# Patient Record
Sex: Female | Born: 1995 | Hispanic: No | Marital: Single | Smoking: Never smoker
Health system: Southern US, Community
[De-identification: ages and names within clinical notes are randomized; demographics above are authoritative.]

## PROBLEM LIST (undated history)

## (undated) DIAGNOSIS — E669 Obesity, unspecified: Secondary | ICD-10-CM

## (undated) DIAGNOSIS — J45909 Unspecified asthma, uncomplicated: Secondary | ICD-10-CM

---
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
## 2016-05-12 IMAGING — CT CT ANGIO CHEST
2 of 8 series · 18 of 46 positions shown · IV contrast (omnipaque)
Comparison: 01/10/2014, 01/09/2014

CLINICAL DATA: Chest trauma, rib fractures, mediastinal widening in
tracheal deviation by follow-up chest x-rays

EXAM:
CT ANGIOGRAPHY CHEST WITH CONTRAST
TECHNIQUE: Multidetector CT imaging of the chest was performed using the
standard protocol during bolus administration of intravenous
contrast. Multiplanar CT image reconstructions and MIPs were
obtained to evaluate the vascular anatomy.
CONTRAST:  80mL OMNIPAQUE IOHEXOL 350 MG/ML SOLN

[Series 5: dissection 2.0 i30f 1 · axial · 0.64mm/px · z∈[+1172,+1400]mm · 15 of 127 slices shown]
[im 7/127  lung]
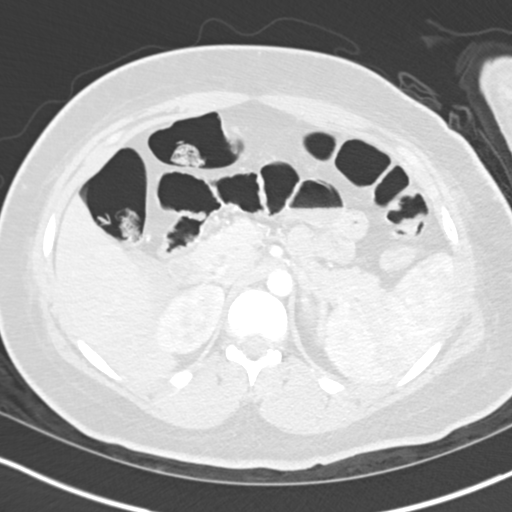
[im 19/127  soft-tissue]
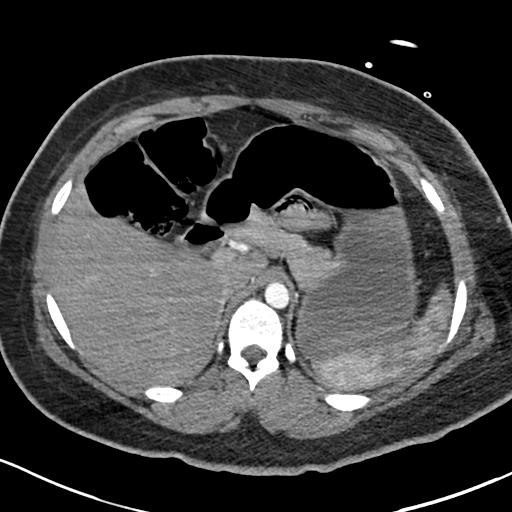
[im 25/127  lung]
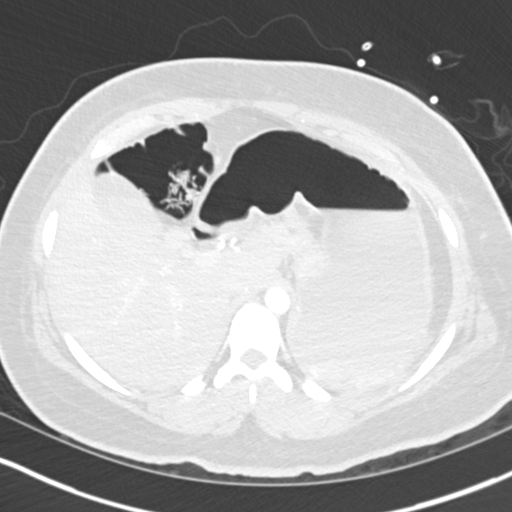
[im 31/127  soft-tissue]
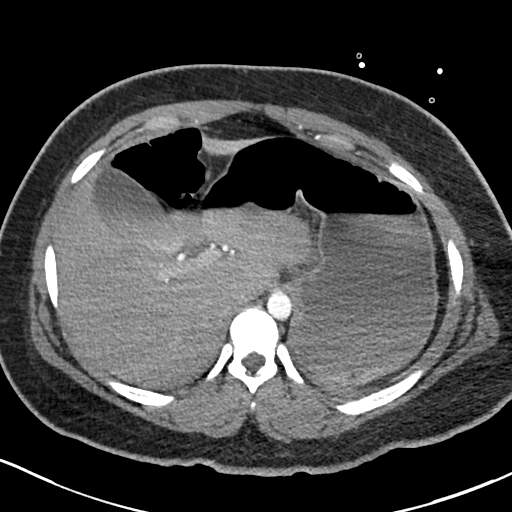
[im 43/127  lung]
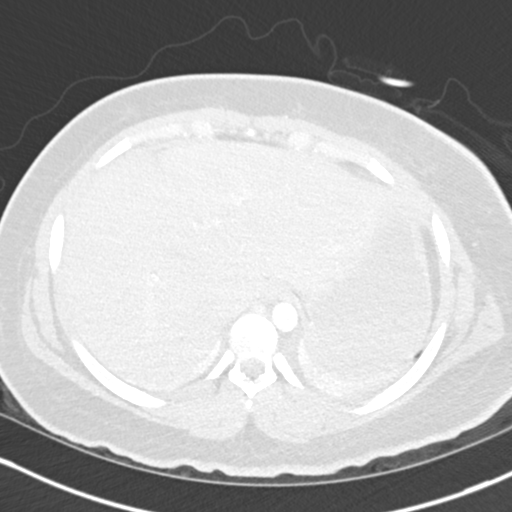
[im 49/127  soft-tissue]
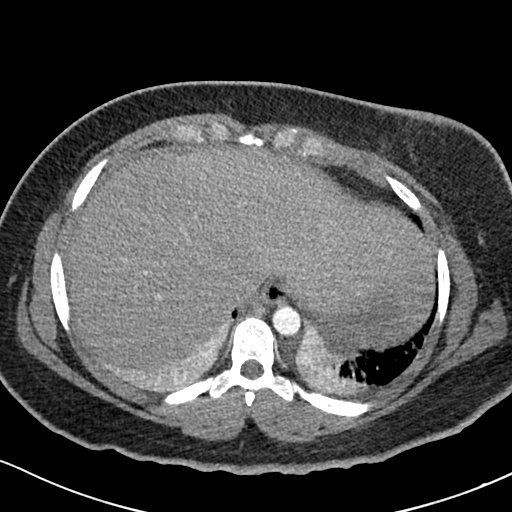
[im 55/127  lung]
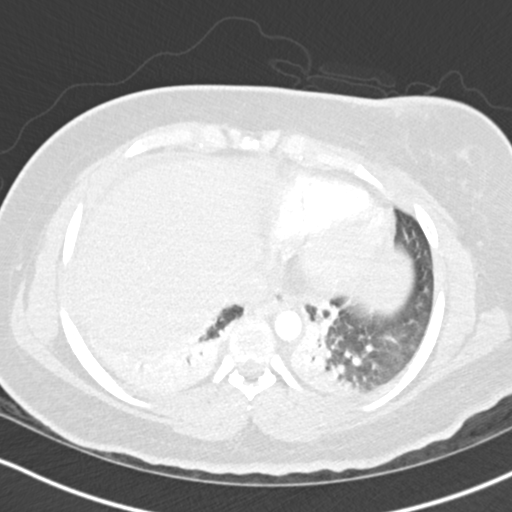
[im 67/127  soft-tissue]
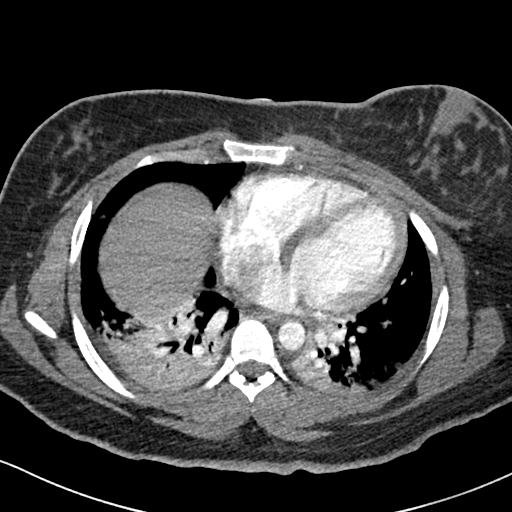
[im 73/127  lung]
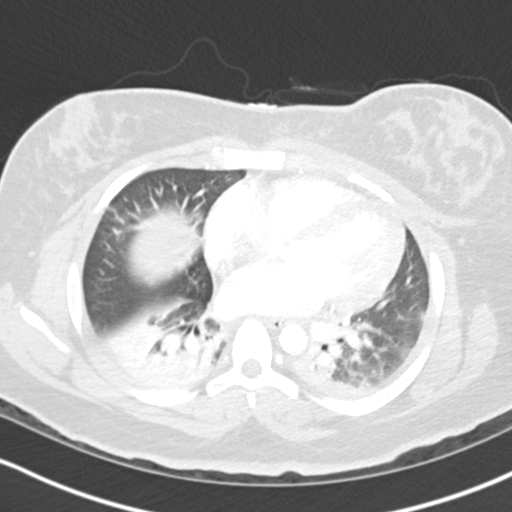
[im 79/127  soft-tissue]
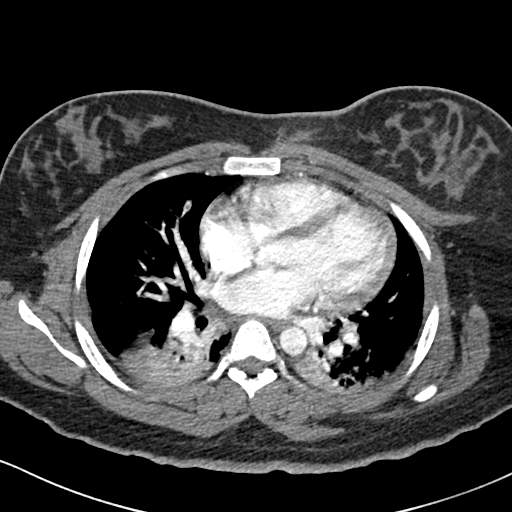
[im 91/127  lung]
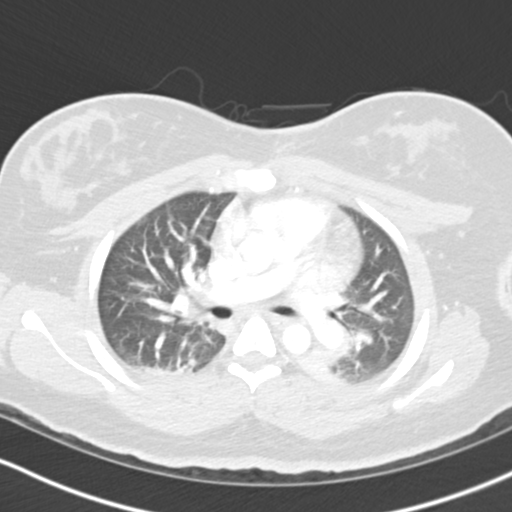
[im 97/127  soft-tissue]
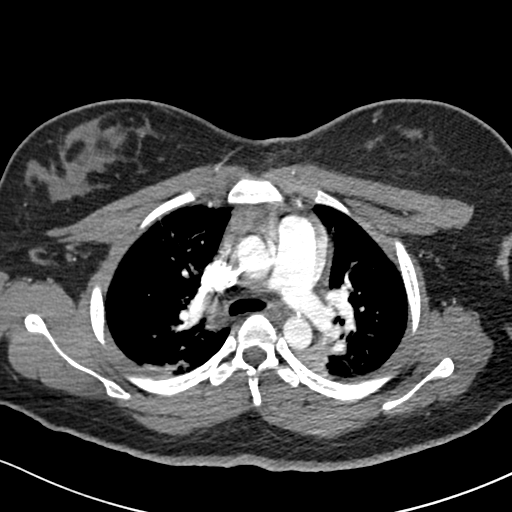
[im 103/127  lung]
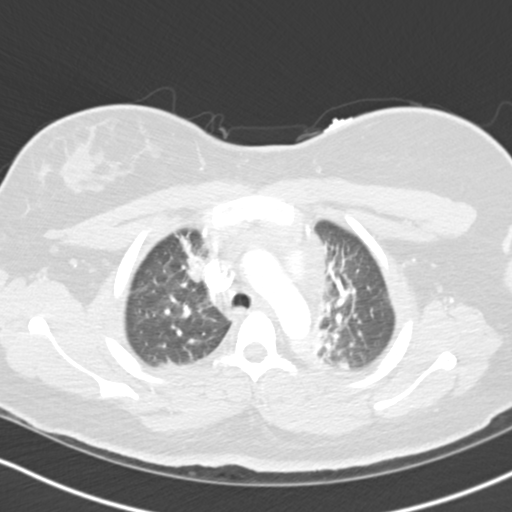
[im 115/127  soft-tissue]
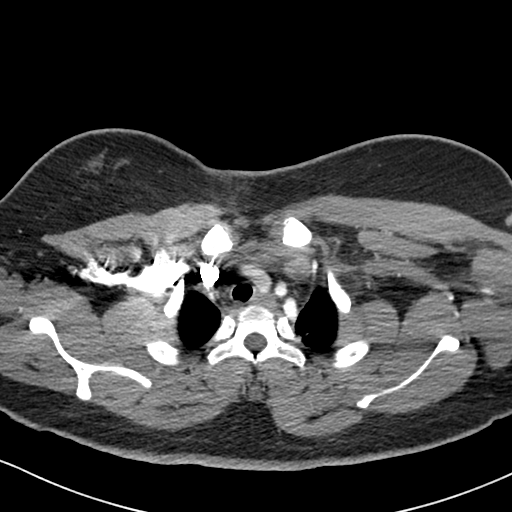
[im 121/127  lung]
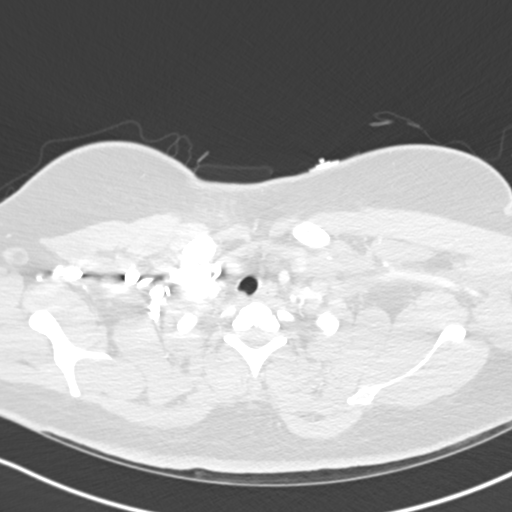

[Series 7: coronal mpr · coronal · 0.65mm/px · 3 of 128 slices shown]
[im 32/128  soft-tissue]
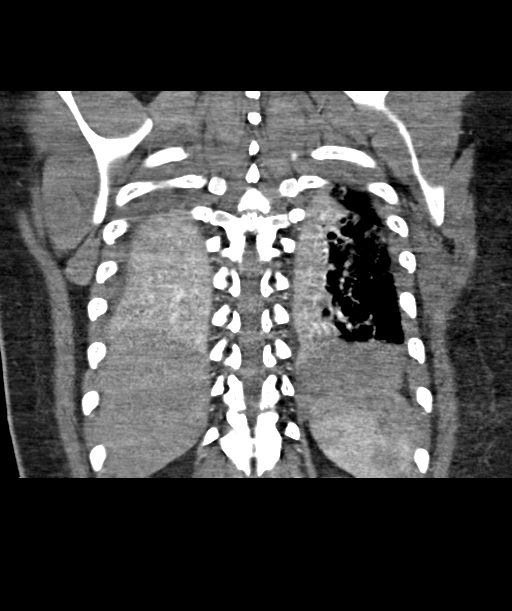
[im 64/128  soft-tissue]
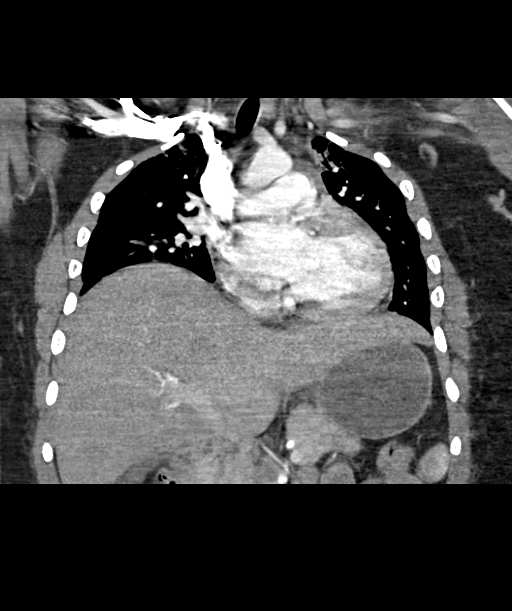
[im 96/128  soft-tissue]
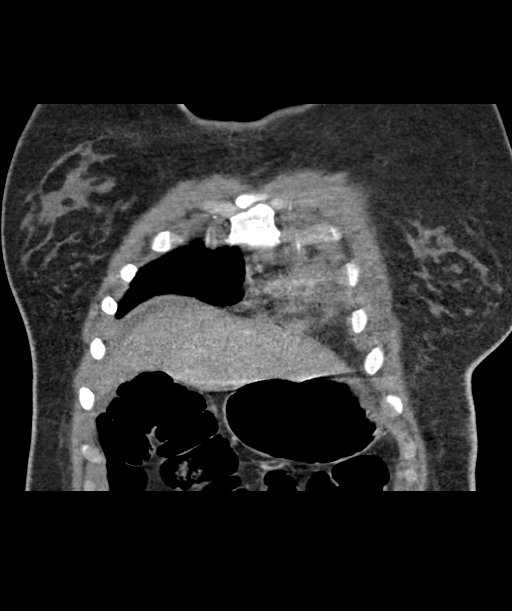

[18 of 46 positions shown; findings below may reference images not displayed]

FINDINGS: Thoracic aorta appears intact. No dissection or focal abnormality.
Major branch vessels remain patent. Residual thymic tissue suspected
in the anterior mediastinum. Central pulmonary arteries are patent.
Increased dense bibasilar atelectasis/collapse. Normal heart size.
No pericardial or pleural effusion. No adenopathy. Right posterior
first rib fracture and at least 2 left rib fractures again evident.
Tracheal deviation appears related to increased lower lobe collapse.
No other acute osseous finding.

Included upper abdomen demonstrates a trace amount of free fluid
along the liver margin as before. Vicarious contrast excretion
within the gallbladder. No other acute upper abdominal findings.

Review of the MIP images confirms the above findings.
IMPRESSION: Very low lung volumes with increased bilateral lower lobe
atelectasis/ collapse.

Intact thoracic aorta

Suspect residual thymic tissue in anterior mediastinum

Acute rib fractures as before

Small amount of right upper quadrant perihepatic free fluid,
nonspecific

These results were called by telephone at the time of interpretation
on 01/10/2014 at [DATE] to Dr. MEHO-FATA MONTONEGRO , who verbally
acknowledged these results.

## 2016-05-14 IMAGING — CR DG CHEST 2V
2 series · 2 of 2 positions shown · non-contrast
Comparison: January 10, 2014.

CLINICAL DATA: Hypoxia.

EXAM:
CHEST  2 VIEW

[w chest pa]
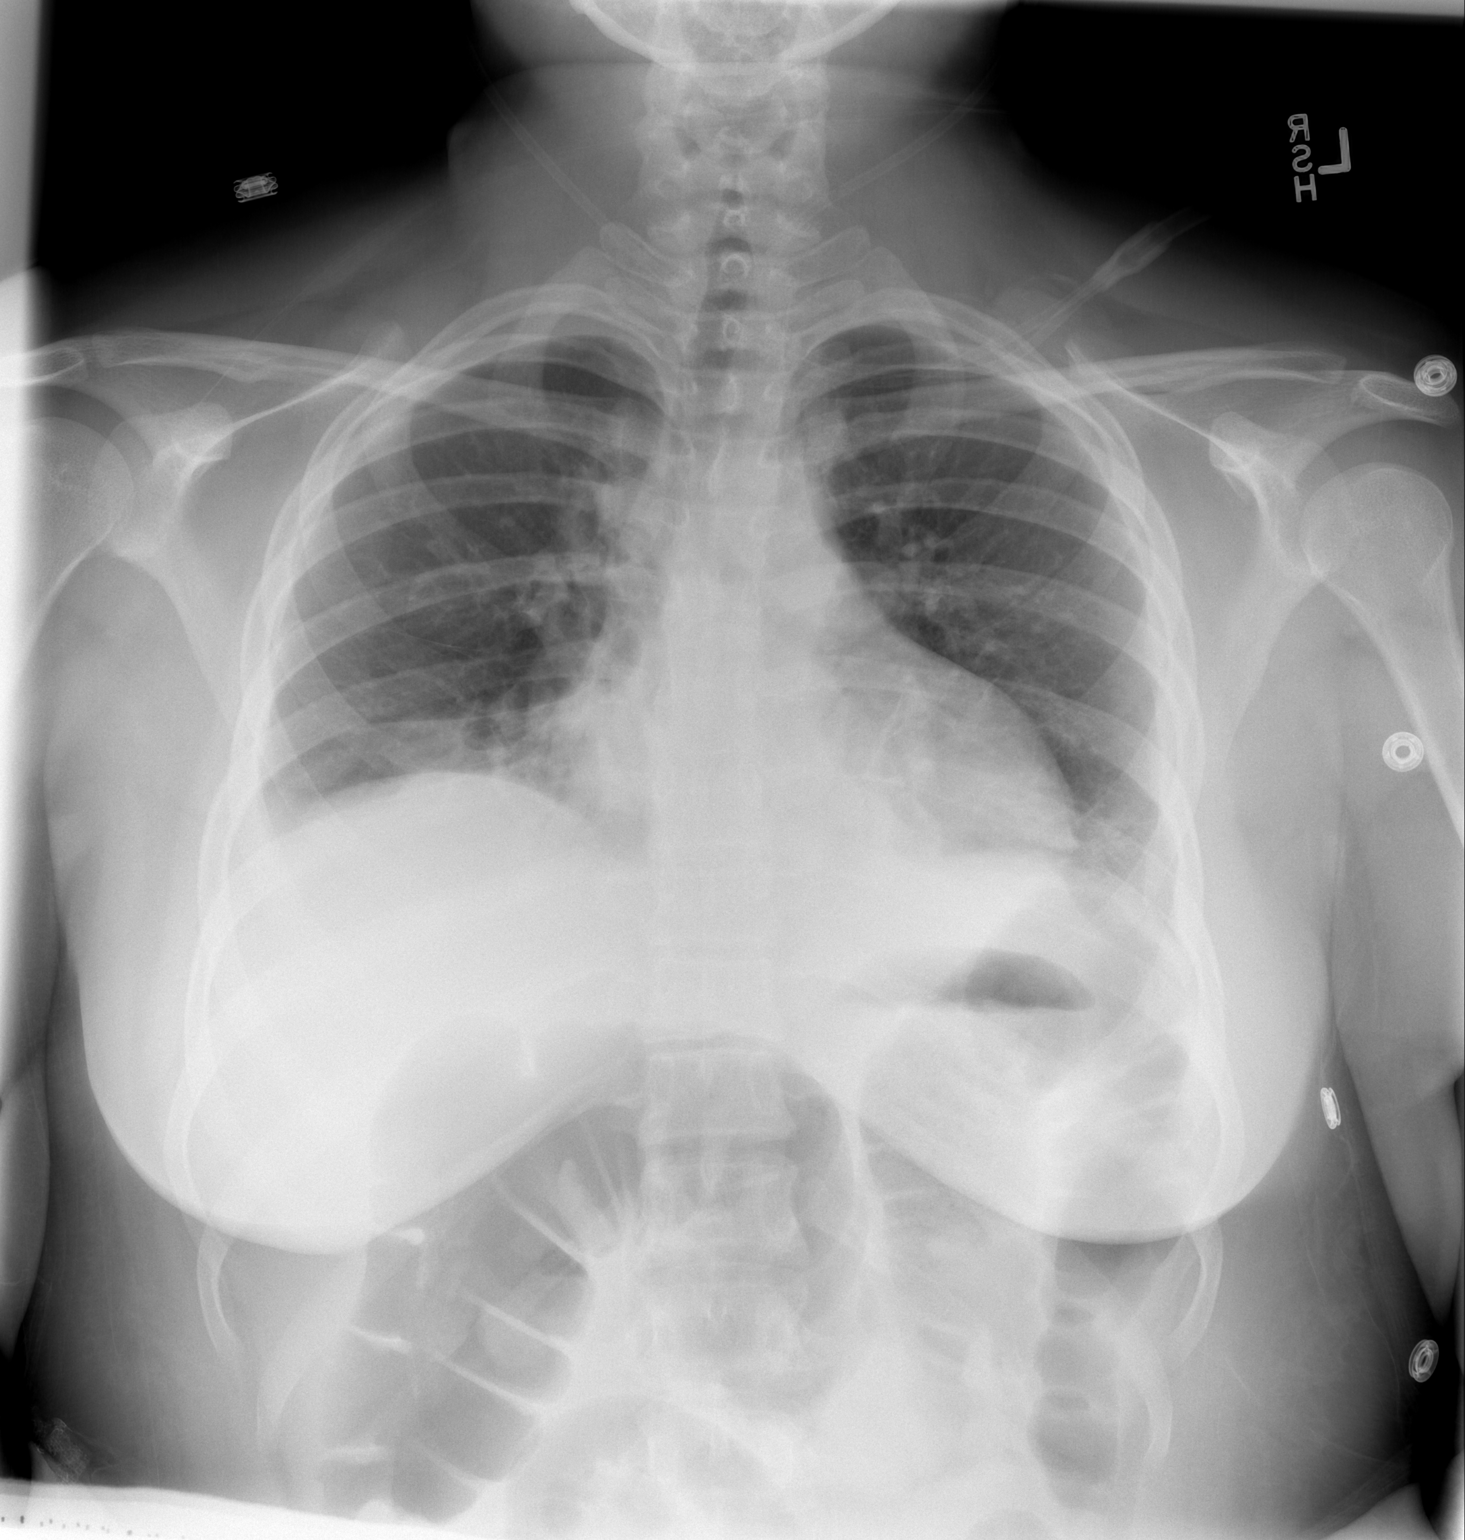

[w chest lat]
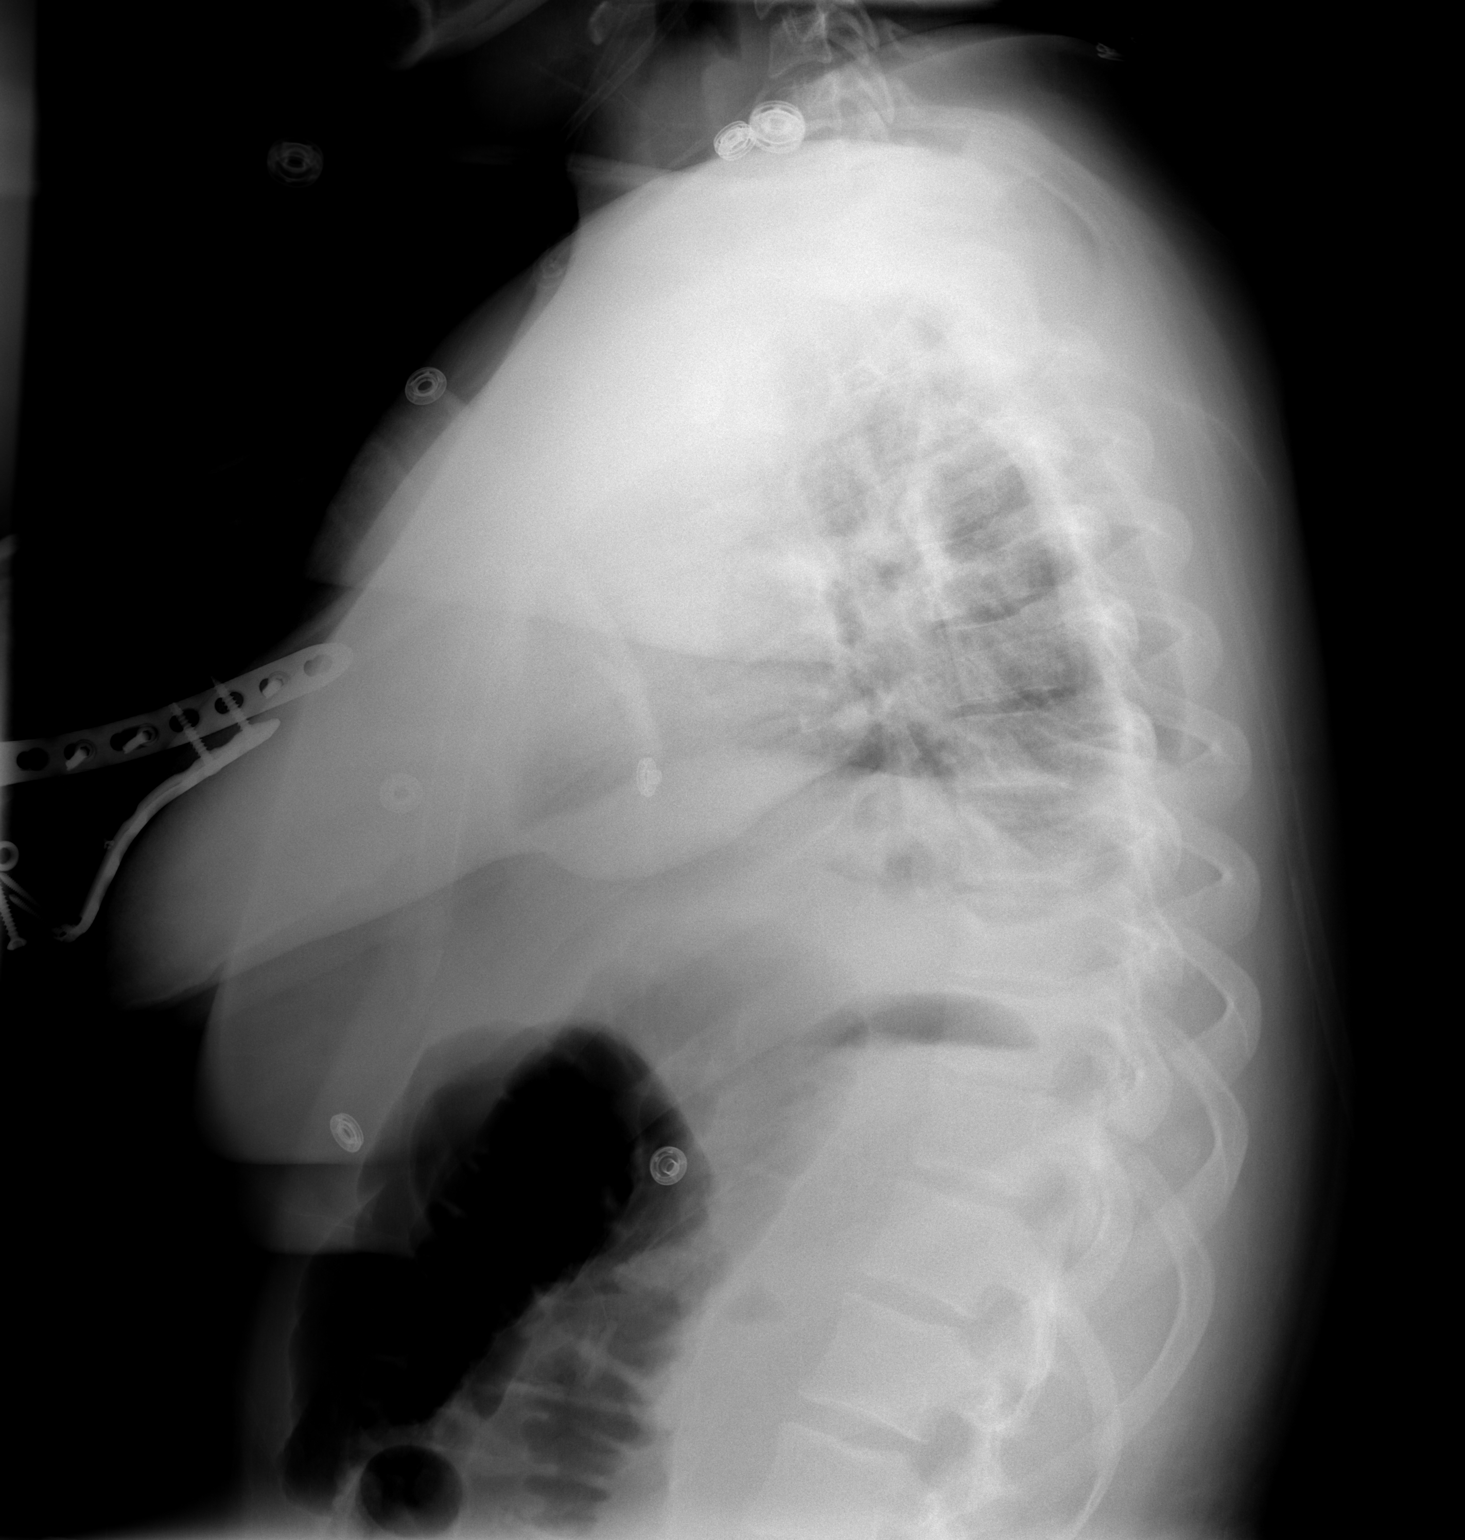

[2 of 2 positions shown; findings below may reference images not displayed]

FINDINGS: Hypoinflation of the lungs is noted. Stable cardiomediastinal
silhouette. Minimal bilateral basilar pleural effusions are noted.
No pneumothorax is noted. Mild bilateral basilar subsegmental
atelectasis is noted. Probable minimally displaced fracture is seen
involving the lateral portion of the left eighth rib.
IMPRESSION: Minimal bilateral pleural effusions. Mild bilateral basilar
subsegmental atelectasis.
